# Patient Record
Sex: Female | Born: 1950 | Race: White | Hispanic: No | Marital: Single | Smoking: Former smoker
Health system: Southern US, Community
[De-identification: ages and names within clinical notes are randomized; demographics above are authoritative.]

## PROBLEM LIST (undated history)

## (undated) DIAGNOSIS — M1990 Unspecified osteoarthritis, unspecified site: Secondary | ICD-10-CM

## (undated) DIAGNOSIS — I10 Essential (primary) hypertension: Secondary | ICD-10-CM

## (undated) HISTORY — PX: VAGINAL HYSTERECTOMY: SUR661

## (undated) HISTORY — PX: ABDOMINAL HYSTERECTOMY: SHX81

## (undated) HISTORY — DX: Unspecified osteoarthritis, unspecified site: M19.90

## (undated) HISTORY — DX: Essential (primary) hypertension: I10

## (undated) HISTORY — PX: KNEE SURGERY: SHX244

---

## 1998-01-22 ENCOUNTER — Inpatient Hospital Stay (HOSPITAL_COMMUNITY): Admission: AD | Admit: 1998-01-22 | Discharge: 1998-01-22 | Payer: Self-pay | Admitting: Obstetrics and Gynecology

## 1999-11-17 ENCOUNTER — Other Ambulatory Visit: Admission: RE | Admit: 1999-11-17 | Discharge: 1999-11-17 | Payer: Self-pay | Admitting: Obstetrics and Gynecology

## 2000-12-07 ENCOUNTER — Other Ambulatory Visit: Admission: RE | Admit: 2000-12-07 | Discharge: 2000-12-07 | Payer: Self-pay | Admitting: Obstetrics and Gynecology

## 2001-04-20 ENCOUNTER — Ambulatory Visit (HOSPITAL_COMMUNITY): Admission: RE | Admit: 2001-04-20 | Discharge: 2001-04-20 | Payer: Self-pay | Admitting: Gastroenterology

## 2001-06-08 ENCOUNTER — Encounter: Payer: Self-pay | Admitting: Internal Medicine

## 2001-06-08 ENCOUNTER — Encounter: Admission: RE | Admit: 2001-06-08 | Discharge: 2001-06-08 | Payer: Self-pay | Admitting: Internal Medicine

## 2006-09-06 ENCOUNTER — Emergency Department (HOSPITAL_COMMUNITY): Admission: EM | Admit: 2006-09-06 | Discharge: 2006-09-06 | Payer: Self-pay | Admitting: Emergency Medicine

## 2014-01-16 ENCOUNTER — Emergency Department (HOSPITAL_COMMUNITY): Payer: Self-pay

## 2014-01-16 ENCOUNTER — Inpatient Hospital Stay: Admit: 2014-01-16 | Payer: Self-pay | Admitting: Orthopedic Surgery

## 2014-01-16 ENCOUNTER — Encounter (HOSPITAL_COMMUNITY): Payer: Self-pay | Admitting: Emergency Medicine

## 2014-01-16 ENCOUNTER — Encounter (HOSPITAL_COMMUNITY): Payer: Self-pay | Admitting: Anesthesiology

## 2014-01-16 ENCOUNTER — Emergency Department (HOSPITAL_COMMUNITY): Payer: Self-pay | Admitting: Anesthesiology

## 2014-01-16 ENCOUNTER — Ambulatory Visit (HOSPITAL_COMMUNITY)
Admission: EM | Admit: 2014-01-16 | Discharge: 2014-01-17 | Disposition: A | Discharge status: Home / Self Care | Payer: Self-pay | Attending: Emergency Medicine | Admitting: Emergency Medicine

## 2014-01-16 ENCOUNTER — Encounter (HOSPITAL_COMMUNITY)
Admission: EM | Disposition: A | Discharge status: Home / Self Care | Payer: Self-pay | Source: Home / Self Care | Attending: Emergency Medicine

## 2014-01-16 DIAGNOSIS — IMO0002 Reserved for concepts with insufficient information to code with codable children: Secondary | ICD-10-CM | POA: Insufficient documentation

## 2014-01-16 DIAGNOSIS — S62619B Displaced fracture of proximal phalanx of unspecified finger, initial encounter for open fracture: Secondary | ICD-10-CM

## 2014-01-16 DIAGNOSIS — S62309A Unspecified fracture of unspecified metacarpal bone, initial encounter for closed fracture: Secondary | ICD-10-CM

## 2014-01-16 DIAGNOSIS — W230XXA Caught, crushed, jammed, or pinched between moving objects, initial encounter: Secondary | ICD-10-CM | POA: Insufficient documentation

## 2014-01-16 DIAGNOSIS — S92309A Fracture of unspecified metatarsal bone(s), unspecified foot, initial encounter for closed fracture: Secondary | ICD-10-CM | POA: Insufficient documentation

## 2014-01-16 HISTORY — PX: OPEN REDUCTION INTERNAL FIXATION (ORIF) FINGER WITH RADIAL BONE GRAFT: SHX5666

## 2014-01-16 SURGERY — OPEN REDUCTION INTERNAL FIXATION (ORIF) FINGER WITH RADIAL BONE GRAFT
Anesthesia: General | Site: Hand | Laterality: Left

## 2014-01-16 MED ORDER — SUCCINYLCHOLINE CHLORIDE 20 MG/ML IJ SOLN
INTRAMUSCULAR | Status: DC | PRN
  Administered 2014-01-16: 120 mg via INTRAVENOUS

## 2014-01-16 MED ORDER — LACTATED RINGERS IV SOLN
INTRAVENOUS | Status: DC | PRN
  Administered 2014-01-16 (×2): via INTRAVENOUS

## 2014-01-16 MED ORDER — PROPOFOL 10 MG/ML IV BOLUS
INTRAVENOUS | Status: DC | PRN
Start: 2014-01-16 — End: 2014-01-17
  Administered 2014-01-16: 150 mg via INTRAVENOUS
  Administered 2014-01-16: 50 mg via INTRAVENOUS

## 2014-01-16 MED ORDER — OXYCODONE HCL 5 MG PO TABS: 5.0000 mg | ORAL_TABLET | Freq: Once | ORAL | Status: AC | PRN

## 2014-01-16 MED ORDER — ONDANSETRON HCL 4 MG/2ML IJ SOLN
4.0000 mg | Freq: Once | INTRAMUSCULAR | Status: AC
  Administered 2014-01-16: 4 mg via INTRAVENOUS
  Filled 2014-01-16: qty 2

## 2014-01-16 MED ORDER — FENTANYL CITRATE 0.05 MG/ML IJ SOLN
100.0000 ug | Freq: Once | INTRAMUSCULAR | Status: AC
  Administered 2014-01-16: 100 ug via INTRAVENOUS
  Filled 2014-01-16: qty 2

## 2014-01-16 MED ORDER — SUCCINYLCHOLINE CHLORIDE 20 MG/ML IJ SOLN
INTRAMUSCULAR | Status: AC
  Filled 2014-01-16: qty 1

## 2014-01-16 MED ORDER — PROPOFOL 10 MG/ML IV BOLUS
INTRAVENOUS | Status: AC
  Filled 2014-01-16: qty 20

## 2014-01-16 MED ORDER — CEFAZOLIN SODIUM-DEXTROSE 2-3 GM-% IV SOLR
INTRAVENOUS | Status: AC
  Filled 2014-01-16: qty 50

## 2014-01-16 MED ORDER — SUFENTANIL CITRATE 50 MCG/ML IV SOLN
INTRAVENOUS | Status: DC | PRN
  Administered 2014-01-16 (×3): 10 ug via INTRAVENOUS

## 2014-01-16 MED ORDER — 0.9 % SODIUM CHLORIDE (POUR BTL) OPTIME
TOPICAL | Status: DC | PRN
  Administered 2014-01-16 (×2): 1000 mL

## 2014-01-16 MED ORDER — ONDANSETRON HCL 4 MG/2ML IJ SOLN: 4.0000 mg | Freq: Once | INTRAMUSCULAR | Status: AC | PRN

## 2014-01-16 MED ORDER — ONDANSETRON HCL 4 MG/2ML IJ SOLN
INTRAMUSCULAR | Status: DC | PRN
  Administered 2014-01-16: 4 mg via INTRAVENOUS

## 2014-01-16 MED ORDER — LIDOCAINE HCL (CARDIAC) 20 MG/ML IV SOLN
INTRAVENOUS | Status: DC | PRN
  Administered 2014-01-16: 100 mg via INTRAVENOUS

## 2014-01-16 MED ORDER — SODIUM CHLORIDE 0.9 % IJ SOLN
INTRAMUSCULAR | Status: AC
  Filled 2014-01-16: qty 10

## 2014-01-16 MED ORDER — OXYCODONE HCL 5 MG/5ML PO SOLN: 5.0000 mg | Freq: Once | ORAL | Status: AC | PRN

## 2014-01-16 MED ORDER — HYDROMORPHONE HCL PF 1 MG/ML IJ SOLN
1.0000 mg | Freq: Once | INTRAMUSCULAR | Status: AC
  Administered 2014-01-16: 1 mg via INTRAVENOUS
  Filled 2014-01-16: qty 1

## 2014-01-16 MED ORDER — CEFAZOLIN SODIUM-DEXTROSE 2-3 GM-% IV SOLR
INTRAVENOUS | Status: DC | PRN
  Administered 2014-01-16: 2 g via INTRAVENOUS

## 2014-01-16 MED ORDER — BUPIVACAINE-EPINEPHRINE PF 0.5-1:200000 % IJ SOLN
INTRAMUSCULAR | Status: DC | PRN
  Administered 2014-01-16: 30 mL via PERINEURAL

## 2014-01-16 MED ORDER — LIDOCAINE HCL (CARDIAC) 20 MG/ML IV SOLN
INTRAVENOUS | Status: AC
  Filled 2014-01-16: qty 5

## 2014-01-16 MED ORDER — MEPERIDINE HCL 25 MG/ML IJ SOLN: 6.2500 mg | INTRAMUSCULAR | Status: DC | PRN

## 2014-01-16 MED ORDER — BUPIVACAINE HCL (PF) 0.25 % IJ SOLN
INTRAMUSCULAR | Status: AC
  Filled 2014-01-16: qty 30

## 2014-01-16 MED ORDER — MIDAZOLAM HCL 5 MG/5ML IJ SOLN
INTRAMUSCULAR | Status: DC | PRN
  Administered 2014-01-16: 2 mg via INTRAVENOUS

## 2014-01-16 MED ORDER — DEXAMETHASONE SODIUM PHOSPHATE 4 MG/ML IJ SOLN
INTRAMUSCULAR | Status: DC | PRN
  Administered 2014-01-16: 4 mg via INTRAVENOUS
  Administered 2014-01-16: 4 mg via PERINEURAL

## 2014-01-16 MED ORDER — SUFENTANIL CITRATE 50 MCG/ML IV SOLN
INTRAVENOUS | Status: AC
  Filled 2014-01-16: qty 1

## 2014-01-16 MED ORDER — HYDROMORPHONE HCL PF 1 MG/ML IJ SOLN: 0.2500 mg | INTRAMUSCULAR | Status: DC | PRN

## 2014-01-16 MED ORDER — MIDAZOLAM HCL 2 MG/2ML IJ SOLN
INTRAMUSCULAR | Status: AC
  Filled 2014-01-16: qty 2

## 2014-01-16 MED ORDER — CEFAZOLIN SODIUM 1-5 GM-% IV SOLN
1.0000 g | Freq: Once | INTRAVENOUS | Status: AC
  Administered 2014-01-16: 1 g via INTRAVENOUS
  Filled 2014-01-16: qty 50

## 2014-01-16 SURGICAL SUPPLY — 42 items
BANDAGE ELASTIC 3 VELCRO ST LF (GAUZE/BANDAGES/DRESSINGS) ×3 IMPLANT
BANDAGE ELASTIC 4 VELCRO ST LF (GAUZE/BANDAGES/DRESSINGS) ×3 IMPLANT
BANDAGE GAUZE ELAST BULKY 4 IN (GAUZE/BANDAGES/DRESSINGS) ×3 IMPLANT
BNDG CMPR 9X4 STRL LF SNTH (GAUZE/BANDAGES/DRESSINGS) ×1
BNDG ESMARK 4X9 LF (GAUZE/BANDAGES/DRESSINGS) ×3 IMPLANT
BNDG GAUZE ELAST 4 BULKY (GAUZE/BANDAGES/DRESSINGS) ×3 IMPLANT
CORDS BIPOLAR (ELECTRODE) ×3 IMPLANT
DRAPE OEC MINIVIEW 54X84 (DRAPES) ×3 IMPLANT
DRAPE SURG 17X23 STRL (DRAPES) ×3 IMPLANT
DRSG ADAPTIC 3X8 NADH LF (GAUZE/BANDAGES/DRESSINGS) IMPLANT
DRSG EMULSION OIL 3X3 NADH (GAUZE/BANDAGES/DRESSINGS) ×3 IMPLANT
DRSG PAD ABDOMINAL 8X10 ST (GAUZE/BANDAGES/DRESSINGS) ×6 IMPLANT
GAUZE XEROFORM 1X8 LF (GAUZE/BANDAGES/DRESSINGS) ×3 IMPLANT
GAUZE XEROFORM 5X9 LF (GAUZE/BANDAGES/DRESSINGS) ×3 IMPLANT
GLOVE BIO SURGEON STRL SZ7.5 (GLOVE) ×3 IMPLANT
GLOVE BIOGEL PI IND STRL 8 (GLOVE) ×1 IMPLANT
GLOVE BIOGEL PI INDICATOR 8 (GLOVE) ×2
GOWN STRL REUS W/ TWL XL LVL3 (GOWN DISPOSABLE) ×1 IMPLANT
GOWN STRL REUS W/TWL XL LVL3 (GOWN DISPOSABLE) ×3
HANDPIECE INTERPULSE COAX TIP (DISPOSABLE)
K-WIRE DBL TROCAR .035X4 ×9 IMPLANT
KIT BASIN OR (CUSTOM PROCEDURE TRAY) ×3 IMPLANT
KIT ROOM TURNOVER OR (KITS) ×3 IMPLANT
KWIRE DBL TROCAR .035X4 ×3 IMPLANT
MANIFOLD NEPTUNE II (INSTRUMENTS) ×3 IMPLANT
NS IRRIG 1000ML POUR BTL (IV SOLUTION) ×3 IMPLANT
PACK ORTHO EXTREMITY (CUSTOM PROCEDURE TRAY) ×3 IMPLANT
PAD ARMBOARD 7.5X6 YLW CONV (MISCELLANEOUS) ×6 IMPLANT
PAD CAST 4YDX4 CTTN HI CHSV (CAST SUPPLIES) ×1 IMPLANT
PADDING CAST COTTON 4X4 STRL (CAST SUPPLIES) ×3
SET HNDPC FAN SPRY TIP SCT (DISPOSABLE) IMPLANT
SPONGE GAUZE 4X4 12PLY (GAUZE/BANDAGES/DRESSINGS) ×3 IMPLANT
SPONGE LAP 18X18 X RAY DECT (DISPOSABLE) IMPLANT
SPONGE LAP 4X18 X RAY DECT (DISPOSABLE) ×3 IMPLANT
SUT ETHILON 4 0 PS 2 18 (SUTURE) ×6 IMPLANT
TOWEL OR 17X24 6PK STRL BLUE (TOWEL DISPOSABLE) ×3 IMPLANT
TOWEL OR 17X26 10 PK STRL BLUE (TOWEL DISPOSABLE) ×3 IMPLANT
TUBE CONNECTING 12'X1/4 (SUCTIONS) ×1
TUBE CONNECTING 12X1/4 (SUCTIONS) ×2 IMPLANT
TUBE FEEDING 5FR 15 INCH (TUBING) IMPLANT
UNDERPAD 30X30 INCONTINENT (UNDERPADS AND DIAPERS) ×3 IMPLANT
WATER STERILE IRR 1000ML POUR (IV SOLUTION) ×3 IMPLANT

## 2014-01-16 NOTE — ED Notes (Signed)
[facility] here to take pt to [facility] ED to meet Dr. Maryan Rued

## 2014-01-16 NOTE — ED Provider Notes (Signed)
Patient transferred from [facility] to see Dr. Fredna Dow for treatment of multiple fractures of the left hand, with possible open intra-articular fracture of proximal phalanx of index finger.  Hand is splinted.  Fingers warm to touch, brisk capillary refill.    Norman Herrlich, NP 01/17/14 [phone]

## 2014-01-16 NOTE — ED Provider Notes (Signed)
CSN: [id number]     Arrival date & time 01/16/14  1700 History   First MD Initiated Contact with Patient 01/16/14 1825     Chief Complaint  Patient presents with  . Hand Pain     (Consider location/radiation/quality/duration/timing/severity/associated sxs/prior Treatment) Patient is a 62 y.o. female presenting with hand pain. The history is provided by the patient.  Hand Pain This is a new problem. Pertinent negatives include no chest pain and no shortness of breath.   patient got her left hand caught log splitter just prior to arrival.` She has pain and deformity. There is mild bleeding. Tetanus is up-to-date. No numbness. No other injury.  History reviewed. No pertinent past medical history. Past Surgical History  Procedure Laterality Date  . Abdominal hysterectomy    . Knee surgery Left    No family history on file. History  Substance Use Topics  . Smoking status: Never Smoker   . Smokeless tobacco: Not on file  . Alcohol Use: 0.0 oz/week    2-3 Glasses of wine per week   OB History   Grav Para Term Preterm Abortions TAB SAB Ect Mult Living                 Review of Systems  Constitutional: Negative for appetite change.  Respiratory: Negative for shortness of breath.   Cardiovascular: Negative for chest pain.  Musculoskeletal:       Left hand pain and deformity  Skin: Positive for wound.  Neurological: Negative for numbness.      Allergies  Review of patient's allergies indicates no known allergies.  Home Medications   Current Outpatient Rx  Name  Route  Sig  Dispense  Refill  . ALPRAZolam (XANAX) 0.25 MG tablet   Oral   Take 0.5 mg by mouth at bedtime as needed for sleep.         Marland Kitchen OVER THE COUNTER MEDICATION   Oral   Take 1 each by mouth daily. Smoothie with vitamins          BP 141/92  Pulse 80  Temp(Src) 97.9 F (36.6 C) (Oral)  Resp 20  Wt 115 lb (52.164 kg)  SpO2 98% Physical Exam  Constitutional: She is oriented to person, place, and  time. She appears well-developed and well-nourished.  HENT:  Head: Normocephalic.  Cardiovascular: Normal rate and regular rhythm.   Pulmonary/Chest: Effort normal and breath sounds normal.  Abdominal: There is no tenderness.  Musculoskeletal:  Swelling and deformity of left hand over the area of second and third MCP joint. There is dorsal swelling. There is tenderness of the left index finger. There is approximately 1.5 cm laceration over the medial aspect of proximal phalanx. Sensation is grossly intact over distal phalanx of index finger medially and laterally. She is able to flex and extend the finger, although with some pain and decreased range of motion. There is tenderness somewhat diffusely over the hand also.  Neurological: She is alert and oriented to person, place, and time.   strong radial pulse on right.  ED Course  Procedures (including critical care time) Labs Review Labs Reviewed - No data to display Imaging Review Dg Hand Complete Left  01/16/2014   CLINICAL DATA:  Injury.  Left hand pain.  EXAM: LEFT HAND - COMPLETE 3+ VIEW  COMPARISON:  None.  FINDINGS: There is marked soft tissue swelling about the hand. There is an acute fracture of the proximal phalanx of the left index finger. The fracture extends from the  volar aspect of the articular surface approximately to the proximal diaphysis. Transverse component through the metaphysis shows mild dorsal angulation. The base of the proximal phalanx of the index finger appears volarly subluxed off the second metacarpal head. There is a nondisplaced fracture extending from the mid diaphysis of the second metacarpal toward the metacarpal head. No other acute bony or joint abnormality is identified. 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  IMPRESSION: Fracture of the proximal phalanx of the left index finger extends from the articular surface at the second MCP joint into the proximal metaphysis and diaphysis.  Nondisplaced fracture  distal first metacarpal.  The base of the proximal phalanx of the left index finger appears volarly subluxed off the second metacarpal head although visualization is limited on the lateral view.  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   Electronically Signed   By: Inge Rise M.D.   On: 01/16/2014 18:25     EKG Interpretation None      MDM   Final diagnoses:  Fracture, metacarpal  Fracture of finger, proximal phalanx, left, open    Patient with hand caught in log splitter. Has multiple fractures in hand. The intra-articular fracture of the proximal phalanx of the index finger may be open. Discussed with Dr. Fredna Dow, who will take the patient in transfer to [facility] cone to go to the operating room. Patient's tetanus is up to date. She was given pain medicines and empiric antibiotics.    Jasper Riling. Alvino Chapel, MD 01/16/14 [phone]

## 2014-01-16 NOTE — ED Notes (Signed)
Bed: WA08 Expected date:  Expected time:  Means of arrival:  Comments: Hold for triage 

## 2014-01-16 NOTE — H&P (Signed)
Desiree Rodriguez is an 63 y.o. female.   Chief Complaint: left hand crush injury HPI: 63 yo lhd female states she got left hand crushed in log splitter ~ 4 PM today.  Seen at [facility] where XR revealed left index finger proximal phalanx fracture and metacarpal fracture.  Also noted to have laceration on index finger.  I was consulted for management of the injury.  Patient transferred to [facility] for evaluation and care.  Reports no previous injury to left hand and no other injury at this time.  History reviewed. No pertinent past medical history.  Past Surgical History  Procedure Laterality Date  . Abdominal hysterectomy    . Knee surgery Left     No family history on file. Social History:  reports that she has never smoked. She does not have any smokeless tobacco history on file. She reports that she drinks alcohol. Her drug history is not on file.  Allergies: No Known Allergies   (Not in a hospital admission)  No results found for this or any previous visit (from the past 48 hour(s)).  Dg Hand Complete Left  01/16/2014   CLINICAL DATA:  Injury.  Left hand pain.  EXAM: LEFT HAND - COMPLETE 3+ VIEW  COMPARISON:  None.  FINDINGS: There is marked soft tissue swelling about the hand. There is an acute fracture of the proximal phalanx of the left index finger. The fracture extends from the volar aspect of the articular surface approximately to the proximal diaphysis. Transverse component through the metaphysis shows mild dorsal angulation. The base of the proximal phalanx of the index finger appears volarly subluxed off the second metacarpal head. There is a nondisplaced fracture extending from the mid diaphysis of the second metacarpal toward the metacarpal head. No other acute bony or joint abnormality is identified. 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  IMPRESSION: Fracture of the proximal phalanx of the left index finger extends from the articular surface at the second MCP joint into the  proximal metaphysis and diaphysis.  Nondisplaced fracture distal first metacarpal.  The base of the proximal phalanx of the left index finger appears volarly subluxed off the second metacarpal head although visualization is limited on the lateral view.  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   Electronically Signed   By: Inge Rise M.D.   On: 01/16/2014 18:25     A comprehensive review of systems was negative.  Blood pressure 141/92, pulse 80, temperature 97.9 F (36.6 C), temperature source Oral, resp. rate 20, weight 52.164 kg (115 lb), SpO2 98.00%.  General appearance: alert, cooperative and appears stated age Head: Normocephalic, without obvious abnormality, atraumatic Neck: supple, symmetrical, trachea midline Resp: clear to auscultation bilaterally Cardio: regular rate and rhythm GI: non tender Extremities: intact sensation and capillary refill all digits. +epl/fpl/io.  left index with wound at volar aspect proximal phalanx.  deformity index proximal phalanx. fdp with flicker of movement.   hand swollen.  pain with motion of index and long finger mp joints.  small abrasions dorsum of hand. Pulses: 2+ and symmetric Skin: Skin color, texture, turgor normal. No rashes or lesions Neurologic: Grossly normal Incision/Wound: As above  Assessment/Plan Left hand crush injury with index proximal phalanx and metacarpal fractures and volar wound.  Recommend OR for I&D of wound with pinning vs ORIF of fractures and fasciotomies of hand for possible developing compartment syndrome.  Risks, benefits, and alternatives of surgery were discussed and the patient agrees with the plan of care.    Corrisa Gibby R 01/16/2014,  9:55 PM

## 2014-01-16 NOTE — Anesthesia Preprocedure Evaluation (Signed)
Anesthesia Evaluation  Patient identified by MRN, date of birth, ID band Patient awake    Reviewed: Allergy & Precautions, H&P , NPO status , Patient's Chart, lab work & pertinent test results  Airway Mallampati: I  Neck ROM: Full    Dental   Pulmonary          Cardiovascular     Neuro/Psych    GI/Hepatic   Endo/Other    Renal/GU      Musculoskeletal   Abdominal   Peds  Hematology   Anesthesia Other Findings   Reproductive/Obstetrics                           Anesthesia Physical Anesthesia Plan  ASA: I and emergent  Anesthesia Plan: General   Post-op Pain Management:    Induction: Intravenous, Rapid sequence and Cricoid pressure planned  Airway Management Planned: Oral ETT  Additional Equipment:   Intra-op Plan:   Post-operative Plan: Extubation in OR  Informed Consent: I have reviewed the patients History and Physical, chart, labs and discussed the procedure including the risks, benefits and alternatives for the proposed anesthesia with the patient or authorized representative who has indicated his/her understanding and acceptance.     Plan Discussed with: CRNA and Surgeon  Anesthesia Plan Comments:         Anesthesia Quick Evaluation  

## 2014-01-16 NOTE — Anesthesia Procedure Notes (Addendum)
Procedure Name: Intubation Date/Time: 01/16/2014 10:55 PM Performed by: Claris Che Pre-anesthesia Checklist: Patient identified, Emergency Drugs available, Suction available and Patient being monitored Patient Re-evaluated:Patient Re-evaluated prior to inductionOxygen Delivery Method: Circle system utilized Preoxygenation: Pre-oxygenation with 100% oxygen Intubation Type: IV induction, Rapid sequence and Cricoid Pressure applied Ventilation: Mask ventilation without difficulty Laryngoscope Size: Mac and 3 Grade View: Grade I Tube type: Oral Tube size: 8.0 mm Number of attempts: 1 Airway Equipment and Method: Stylet and Oral airway Placement Confirmation: ETT inserted through vocal cords under direct vision,  positive ETCO2 and breath sounds checked- equal and bilateral Secured at: 22 cm Tube secured with: Tape Dental Injury: Teeth and Oropharynx as per pre-operative assessment    Anesthesia Regional Block:  Supraclavicular block  Pre-Anesthetic Checklist: ,, timeout performed, Correct Patient, Correct Site, Correct Laterality, Correct Procedure, Correct Position, site marked, Risks and benefits discussed,  Surgical consent,  Pre-op evaluation,  At surgeon's request and post-op pain management  Laterality: Left  Prep: chloraprep       Needles:  Injection technique: Single-shot  Needle Type: Echogenic Stimulator Needle     Needle Length: 9cm 9 cm Needle Gauge: 21 and 21 G    Additional Needles:  Procedures: ultrasound guided (picture in chart) and nerve stimulator Supraclavicular block  Nerve Stimulator or Paresthesia:  Response: 0.4 mA,   Additional Responses:   Narrative:  Start time: 01/16/2014 10:15 PM End time: 01/16/2014 10:30 PM Injection made incrementally with aspirations every 5 mL.  Performed by: Personally  Anesthesiologist: Lillia Abed MD  Additional Notes: Monitors applied. Patient sedated. Sterile prep and drape,hand hygiene and sterile gloves  were used. Relevant anatomy identified.Needle position confirmed.Local anesthetic injected incrementally after negative aspiration. Local anesthetic spread visualized around nerve(s). Vascular puncture avoided. No complications. Image printed for medical record.The patient tolerated the procedure well.

## 2014-01-16 NOTE — ED Notes (Signed)
Pt exam by Dr Fredna Dow, or consent sign by patient,  Pt awaiting to go to OR

## 2014-01-16 NOTE — ED Notes (Signed)
Pt crushed L hand in log splitter around 1600 today. Deformity noted to hand. No active bleeding at this time.

## 2014-01-16 NOTE — ED Notes (Signed)
Pt's ring removed from 4th finger on L hand.

## 2014-01-17 ENCOUNTER — Encounter (HOSPITAL_COMMUNITY): Payer: Self-pay | Admitting: Orthopedic Surgery

## 2014-01-17 MED ORDER — OXYCODONE-ACETAMINOPHEN 5-325 MG PO TABS: ORAL_TABLET | ORAL | Status: AC

## 2014-01-17 MED ORDER — SULFAMETHOXAZOLE-TRIMETHOPRIM 800-160 MG PO TABS: 1.0000 | ORAL_TABLET | Freq: Two times a day (BID) | ORAL | Status: DC

## 2014-01-17 NOTE — Op Note (Signed)
Intra-operative fluoroscopic images in the AP, lateral, and oblique views were taken and evaluated by myself.  Reduction and hardware placement were confirmed.  There was no intraarticular penetration of permanent hardware.  

## 2014-01-17 NOTE — Transfer of Care (Signed)
Immediate Anesthesia Transfer of Care Note  Patient: Desiree Rodriguez  Procedure(s) Performed: Procedure(s): Irrigation and debridement OPEN REDUCTION INTERNAL FIXATION (ORIF) Left Index FINGER, fasciotomy (Left)  Patient Location: PACU  Anesthesia Type:GA combined with regional for post-op pain  Level of Consciousness: awake, alert , oriented and patient cooperative  Airway & Oxygen Therapy: Patient Spontanous Breathing and Patient connected to nasal cannula oxygen  Post-op Assessment: Report given to PACU RN, Post -op Vital signs reviewed and stable and Patient moving all extremities X 4  Post vital signs: Reviewed and stable  Complications: No apparent anesthesia complications

## 2014-01-17 NOTE — Discharge Instructions (Signed)

## 2014-01-17 NOTE — Anesthesia Postprocedure Evaluation (Signed)
Anesthesia Post Note  Patient: Desiree Rodriguez  Procedure(s) Performed: Procedure(s) (LRB): Irrigation and debridement OPEN REDUCTION INTERNAL FIXATION (ORIF) Left Index FINGER, fasciotomy (Left)  Anesthesia type: general  Patient location: PACU  Post pain: Pain level controlled  Post assessment: Patient's Cardiovascular Status Stable  Last Vitals:  Filed Vitals:   01/17/14 0100  BP: 143/89  Pulse: 88  Temp: 36.6 C  Resp: 12    Post vital signs: Reviewed and stable  Level of consciousness: sedated  Complications: No apparent anesthesia complications

## 2014-01-17 NOTE — Brief Op Note (Signed)
01/16/2014 - 01/17/2014  12:32 AM  PATIENT:  Desiree Rodriguez  63 y.o. female  PRE-OPERATIVE DIAGNOSIS:  Crush Injury to Left Index Finger  POST-OPERATIVE DIAGNOSIS:  Crush Injury to Left Index Finger  PROCEDURE:  Procedure(s): Irrigation and debridement OPEN REDUCTION INTERNAL FIXATION (ORIF) Left Index FINGER, fasciotomy (Left)  SURGEON:  Surgeon(s) and Role:    * Tennis Must, MD - Primary  PHYSICIAN ASSISTANT:   ASSISTANTS: none   ANESTHESIA:   regional and general  EBL:  Total I/O In: 1000 [I.V.:1000] Out: -   BLOOD ADMINISTERED:none  DRAINS: none   LOCAL MEDICATIONS USED:  NONE  SPECIMEN:  No Specimen  DISPOSITION OF SPECIMEN:  N/A  COUNTS:  YES  TOURNIQUET:   Total Tourniquet Time Documented: Upper Arm (Left) - 77 minutes Total: Upper Arm (Left) - 77 minutes   DICTATION: .Other Dictation: Dictation Number [id number]  PLAN OF CARE: Discharge to home after PACU  PATIENT DISPOSITION:  PACU - hemodynamically stable.

## 2014-01-17 NOTE — ED Provider Notes (Signed)
Medical screening examination/treatment/procedure(s) were performed by non-physician practitioner and as supervising physician I was immediately available for consultation/collaboration.   EKG Interpretation None        Ephraim Hamburger, MD 01/17/14 1414

## 2014-01-17 NOTE — Op Note (Signed)
NAME:  Desiree Rodriguez, Desiree Rodriguez NO.:  [id number]  MEDICAL RECORD NO.:  [id number]  LOCATION:  [facility]                         FACILITY:  [facility]  PHYSICIAN:  Leanora Cover, MD        DATE OF BIRTH:  [birth year]  DATE OF PROCEDURE:  01/16/2014 DATE OF DISCHARGE:                              OPERATIVE REPORT   PREOPERATIVE DIAGNOSIS:  Crush injury of left hand with index finger proximal phalanx and metacarpal fractures and possible early compartment syndrome.  POSTOPERATIVE DIAGNOSIS:  Crush injury of left hand with index finger proximal phalanx and metacarpal fractures and possible early compartment syndrome.  PROCEDURES:   1. Left hand exploration of volar index finger wound and repair of wound 2. Fasciotomy left hand 3. Closed reduction and percutaneous pinning of index finger proximal phalanx   fracture, intra-articular at MP joint 4. Percutaneous pinning of index finger metacarpal fracture  SURGEON:  Leanora Cover, MD  ASSISTANT:  None.  ANESTHESIA:  General with regional.  IV FLUIDS:  Per anesthesia flow sheet.  ESTIMATED BLOOD LOSS:  Minimal.  COMPLICATIONS:  None.  SPECIMENS:  None.  TOURNIQUET TIME:  77 minutes.  DISPOSITION:  Stable to PACU.  INDICATIONS:  Ms. Clanton is a 63 year old left-hand dominant female who states that in the afternoon of January 16, 2014, she was using a wood splitter, when her hand was caught between the wood and the base.  This caused a crush injury to the hand.  She was seen at [facility] Emergency Department where radiographs were taken, revealing an index finger proximal phalanx fracture that was felt to be open and an index finger metacarpal fracture.  I was consulted for management of injury.  She was transferred to [facility] for my care.  On examination, she had intact sensation and capillary refill in all fingertips.  She could flex and extend at the IP joint of thumb and cross her fingers lightly.  She had pain with  extension of the long finger at the MP joint and tenderness to palpation in the index finger and index finger metacarpal.  She was significantly swollen on the dorsum and volar aspect of the hand.  She was not tender in the thenar eminence.  She was less tender at the ulnar side of the hand.  There was a wound at the volar aspect of the proximal phalanx of the index finger.  I recommend to Ms. Bagent going to the operating room for irrigation, debridement and exploration of the wound, fasciotomies for prophylaxis against potential early compartment syndrome, and percutaneous pinning versus open reduction and internal fixation of index finger proximal phalanx intra-articular fracture, and metacarpal fracture.  Risks, benefits and alternatives of surgery were discussed including the risk of blood loss; infection; damage to nerves, vessels, tendons, ligaments, bone; failure of surgery; need for additional surgery; complications with wound healing; nonunion; malunion; stiffness.  She voiced understanding of these risks and elected to proceed.  OPERATIVE COURSE:  After being identified preoperatively by myself, the patient and I agreed upon the procedure and site of procedure.  Surgical site was marked.  Risks, benefits, and alternatives of surgery were reviewed and she wished to proceed.  Surgical consent  had been signed. She was given IV Ancef as preoperative antibiotic prophylaxis and her tetanus had been updated in the emergency department.  Regional block was performed by Anesthesia in preoperative holding.  She was transferred to the operating room and placed on the operating room table in a supine position with the left upper extremity on an armboard. General anesthesia was induced by Anesthesiology.  Left upper extremity was prepped and draped in normal sterile orthopedic fashion.  A surgical pause was performed between the surgeons, anesthesia, and operating room staff, and all  were in agreement as to the patient, procedure, and site of procedure.  Tourniquet at the proximal aspect of the extremity was inflated to 250 mmHg after exsanguination of the limb with an Esmarch bandage.  The wound at the volar aspect of the index finger was explored.  It was extended proximally.  Bipolar electrocautery was used to obtain hemostasis.  The radial and ulnar neurovascular bundles were identified and were intact.  There was blood within the tendon sheath. The sheath was opened.  The FDP and FDS tendons were intact.  The wound was copiously irrigated with 1000 mL of sterile saline.  It was then closed with 4-0 nylon suture in a horizontal mattress fashion.  Two incisions were mode on the dorsum of the hand, one between the index and long finger metacarpals and one at the ring finger metacarpal.  These were carried into subcutaneous tissues by spreading technique.  There was significant hematoma and blood within the subcutaneous tissues. Bipolar electrocautery was used to obtain hemostasis.  Fasciotomies were performed of the spaces between the index and long, long and ring and ring and small fingers as well as between the index and thumb metacarpals.  The muscle was not significantly swollen at this time. The wounds were copiously irrigated with 1000 mL of sterile saline by bulb syringe.  The skin was able to be closed with 4-0 nylon in a horizontal mattress fashion without any tension.  Attention was turned to the fractures.  Closed reduction of the index finger proximal phalanx intra-articular fracture was performed.  Two 0.035 inch K-wires were then advanced from the proximal fracture fragments across the fracture site into the distal aspect of the proximal phalanx.  This was adequate to stabilize the fracture.  An additional 0.035-inch K-wire was advanced from radial to ulnar across the intra-articular portion of the fracture at the base of the index finger metacarpal.   These pins were adequate to stabilize the index finger proximal phalanx fracture.  The wrist was placed through tenodesis and there was good cascade of the index finger as well as alignment of the finger without any scissoring.  Two 0.035- inch K-wires were then advanced from radial to ulnar across the fracture of the index finger metacarpal.  The C-arm was used in AP, lateral, and oblique projections throughout the case to aid in reduction and position of hardware.  Good reduction and pin position was achieved.  The pins were all bent and cut short.  The wounds and pin sites were all dressed with sterile Xeroform, 4x4s, and wrapped with a Kerlix bandage.  A volar and dorsal slab splint was placed including the index, long, ring, and small fingers with the MPs flexed and the IPs extended.  This was wrapped with Kerlix and Ace bandage.  Tourniquet was deflated at 77 minutes. Fingertips were pink with brisk capillary refill after deflation of the tourniquet.  The operative drapes were broken down, and the patient  was awoken from anesthesia safely.  She was transferred back to the stretcher and taken to PACU in stable condition.  I will see her back in the office in 1 week for postoperative followup.  I will give her Percocet 5/325, 1-2 p.o. q.6 hours p.r.n. pain, dispensed #40, and Bactrim DS 1 p.o. b.i.d. x7 days.     Leanora Cover, MD     KK/MEDQ  D:  01/17/2014  T:  01/17/2014  Job:  [id number]

## 2014-01-17 NOTE — Op Note (Signed)
[id number] 

## 2016-12-17 ENCOUNTER — Encounter: Payer: Self-pay | Admitting: Gastroenterology

## 2017-02-02 ENCOUNTER — Ambulatory Visit (AMBULATORY_SURGERY_CENTER): Payer: Self-pay

## 2017-02-02 VITALS — Ht 62.0 in | Wt 113.0 lb

## 2017-02-02 DIAGNOSIS — Z1211 Encounter for screening for malignant neoplasm of colon: Secondary | ICD-10-CM

## 2017-02-02 MED ORDER — NA SULFATE-K SULFATE-MG SULF 17.5-3.13-1.6 GM/177ML PO SOLN: 1.0000 | Freq: Once | ORAL | 0 refills | Status: AC

## 2017-02-02 NOTE — Progress Notes (Signed)
Denies allergies to eggs or soy products. Denies complication of anesthesia or sedation. Denies use of weight loss medication. Denies use of O2.   Emmi instructions given for colonoscopy.  

## 2017-02-03 ENCOUNTER — Encounter: Payer: Self-pay | Admitting: Gastroenterology

## 2017-02-11 ENCOUNTER — Ambulatory Visit (AMBULATORY_SURGERY_CENTER): Payer: Medicare Other | Admitting: Gastroenterology

## 2017-02-11 ENCOUNTER — Encounter: Payer: Self-pay | Admitting: Gastroenterology

## 2017-02-11 VITALS — BP 108/72 | HR 71 | Temp 98.4°F | Resp 14 | Ht 62.0 in | Wt 113.0 lb

## 2017-02-11 DIAGNOSIS — Z1211 Encounter for screening for malignant neoplasm of colon: Secondary | ICD-10-CM | POA: Diagnosis present

## 2017-02-11 DIAGNOSIS — Z1212 Encounter for screening for malignant neoplasm of rectum: Secondary | ICD-10-CM | POA: Diagnosis not present

## 2017-02-11 DIAGNOSIS — D123 Benign neoplasm of transverse colon: Secondary | ICD-10-CM | POA: Diagnosis not present

## 2017-02-11 MED ORDER — SODIUM CHLORIDE 0.9 % IV SOLN: 500.0000 mL | INTRAVENOUS | Status: AC

## 2017-02-11 NOTE — Op Note (Signed)
[place] Patient Name: Desiree Rodriguez Procedure Date: 02/11/2017 3:22 PM MRN: [id number] Endoscopist: Remo Lipps P. Montrae Braithwaite MD, MD Age: 66 Referring MD:  Date of Birth: 1950-11-08 Gender: Female Account #: [id number] Procedure:                Colonoscopy Indications:              Screening for colorectal malignant neoplasm Medicines:                Monitored Anesthesia Care Procedure:                Pre-Anesthesia Assessment:                           - Prior to the procedure, a History and Physical                            was performed, and patient medications and                            allergies were reviewed. The patient's tolerance of                            previous anesthesia was also reviewed. The risks                            and benefits of the procedure and the sedation                            options and risks were discussed with the patient.                            All questions were answered, and informed consent                            was obtained. Prior Anticoagulants: The patient has                            taken no previous anticoagulant or antiplatelet                            agents. ASA Grade Assessment: II - A patient with                            mild systemic disease. After reviewing the risks                            and benefits, the patient was deemed in                            satisfactory condition to undergo the procedure.                           After obtaining informed consent, the colonoscope  was passed under direct vision. Throughout the                            procedure, the patient's blood pressure, pulse, and                            oxygen saturations were monitored continuously. The                            Model PCF-H190DL [id number]) scope was introduced                            through the anus and advanced to the the cecum,   identified by appendiceal orifice and ileocecal                            valve. The colonoscopy was performed without                            difficulty. The patient tolerated the procedure                            well. The quality of the bowel preparation was                            good. The ileocecal valve, appendiceal orifice, and                            rectum were photographed. Scope In: 3:23:40 PM Scope Out: 3:43:05 PM Scope Withdrawal Time: 0 hours 15 minutes 52 seconds  Total Procedure Duration: 0 hours 19 minutes 25 seconds  Findings:                 The perianal and digital rectal examinations were                            normal.                           Two sessile polyps were found in the transverse                            colon. The polyps were 3 to 4 mm in size. These                            polyps were removed with a cold snare. Resection                            and retrieval were complete.                           Scattered medium-mouthed diverticula were found in                            the left colon.  Anal papilla(e) were hypertrophied with internal                            hemorrhoids.                           A diffuse area of mild melanosis was found in the                            entire colon.                           The exam was otherwise without abnormality. Complications:            No immediate complications. Estimated blood loss:                            Minimal. Estimated Blood Loss:     Estimated blood loss was minimal. Impression:               - Two 3 to 4 mm polyps in the transverse colon,                            removed with a cold snare. Resected and retrieved.                           - Diverticulosis in the left colon.                           - Anal papilla(e) were hypertrophied.                           - Hemorrhoids                           - Melanosis in the colon.                            - The examination was otherwise normal. Recommendation:           - Patient has a contact number available for                            emergencies. The signs and symptoms of potential                            delayed complications were discussed with the                            patient. Return to normal activities tomorrow.                            Written discharge instructions were provided to the                            patient.                           -  Resume previous diet.                           - Continue present medications.                           - Await pathology results.                           - Repeat colonoscopy is recommended for                            surveillance. The colonoscopy date will be                            determined after pathology results from today's                            exam become available for review.                           - No ibuprofen, naproxen, or other non-steroidal                            anti-inflammatory drugs for 2 weeks after polyp                            removal. Remo Lipps P. Nicholous Girgenti MD, MD 02/11/2017 3:46:42 PM This report has been signed electronically.

## 2017-02-11 NOTE — Patient Instructions (Signed)
YOU HAD AN ENDOSCOPIC PROCEDURE TODAY AT [facility]:   Refer to the procedure report that was given to you for any specific questions about what was found during the examination.  If the procedure report does not answer your questions, please call your gastroenterologist to clarify.  If you requested that your care partner not be given the details of your procedure findings, then the procedure report has been included in a sealed envelope for you to review at your convenience later.  YOU SHOULD EXPECT: Some feelings of bloating in the abdomen. Passage of more gas than usual.  Walking can help get rid of the air that was put into your GI tract during the procedure and reduce the bloating. If you had a lower endoscopy (such as a colonoscopy or flexible sigmoidoscopy) you may notice spotting of blood in your stool or on the toilet paper. If you underwent a bowel prep for your procedure, you may not have a normal bowel movement for a few days.  Please Note:  You might notice some irritation and congestion in your nose or some drainage.  This is from the oxygen used during your procedure.  There is no need for concern and it should clear up in a day or so.  SYMPTOMS TO REPORT IMMEDIATELY:   Following lower endoscopy (colonoscopy or flexible sigmoidoscopy):  Excessive amounts of blood in the stool  Significant tenderness or worsening of abdominal pains  Swelling of the abdomen that is new, acute  Fever of 100F or higher   For urgent or emergent issues, a gastroenterologist can be reached at any hour by calling [phone].   DIET:  We do recommend a small meal at first, but then you may proceed to your regular diet.  Drink plenty of fluids but you should avoid alcoholic beverages for 24 hours. Try to increase the fiber in your diet, and drink plenty of water.  ACTIVITY:  You should plan to take it easy for the rest of today and you should NOT DRIVE or use heavy machinery until  tomorrow (because of the sedation medicines used during the test).    FOLLOW UP: Our staff will call the number listed on your records the next business day following your procedure to check on you and address any questions or concerns that you may have regarding the information given to you following your procedure. If we do not reach you, we will leave a message.  However, if you are feeling well and you are not experiencing any problems, there is no need to return our call.  We will assume that you have returned to your regular daily activities without incident.  If any biopsies were taken you will be contacted by phone or by letter within the next 1-3 weeks.  Please call us at [phone] if you have not heard about the biopsies in 3 weeks.    SIGNATURES/CONFIDENTIALITY: You and/or your care partner have signed paperwork which will be entered into your electronic medical record.  These signatures attest to the fact that that the information above on your After Visit Summary has been reviewed and is understood.  Full responsibility of the confidentiality of this discharge information lies with you and/or your care-partner.  No aspirin nor ibuprofen for two weeks per Dr. Havery Moros.

## 2017-02-11 NOTE — Progress Notes (Signed)
Report given to PACU, vss 

## 2017-02-11 NOTE — Progress Notes (Signed)
Called to room to assist during endoscopic procedure.  Patient ID and intended procedure confirmed with present staff. Received instructions for my participation in the procedure from the performing physician.  

## 2017-02-12 ENCOUNTER — Telehealth: Payer: Self-pay

## 2017-02-12 NOTE — Telephone Encounter (Signed)
  Follow up Call-  Call back number 02/11/2017  Post procedure Call Back phone  # [phone]  Permission to leave phone message Yes  Some recent data might be hidden     Patient questions:  Do you have a fever, pain , or abdominal swelling? No. Pain Score  0 *  Have you tolerated food without any problems? Yes.    Have you been able to return to your normal activities? Yes.    Do you have any questions about your discharge instructions: Diet   No. Medications  No. Follow up visit  No.  Do you have questions or concerns about your Care? No.  Actions: * If pain score is 4 or above: No action needed, pain <4.

## 2017-02-25 ENCOUNTER — Encounter: Payer: Self-pay | Admitting: Gastroenterology

## 2019-12-29 ENCOUNTER — Other Ambulatory Visit: Payer: Self-pay | Admitting: Nurse Practitioner

## 2019-12-29 DIAGNOSIS — G43809 Other migraine, not intractable, without status migrainosus: Secondary | ICD-10-CM

## 2019-12-29 DIAGNOSIS — G4452 New daily persistent headache (NDPH): Secondary | ICD-10-CM

## 2020-01-12 ENCOUNTER — Other Ambulatory Visit: Payer: Self-pay | Admitting: Nurse Practitioner

## 2020-10-25 ENCOUNTER — Other Ambulatory Visit: Payer: Self-pay | Admitting: Nurse Practitioner

## 2020-10-25 DIAGNOSIS — M542 Cervicalgia: Secondary | ICD-10-CM

## 2021-01-29 ENCOUNTER — Other Ambulatory Visit: Payer: Self-pay | Admitting: Nurse Practitioner

## 2021-01-29 DIAGNOSIS — M542 Cervicalgia: Secondary | ICD-10-CM

## 2021-03-10 ENCOUNTER — Other Ambulatory Visit: Payer: Self-pay

## 2021-03-10 ENCOUNTER — Ambulatory Visit
Admission: RE | Admit: 2021-03-10 | Discharge: 2021-03-10 | Disposition: A | Discharge status: Home / Self Care | Payer: Medicare Other | Source: Ambulatory Visit | Attending: Nurse Practitioner | Admitting: Nurse Practitioner

## 2021-03-10 DIAGNOSIS — M542 Cervicalgia: Secondary | ICD-10-CM

## 2021-04-25 ENCOUNTER — Other Ambulatory Visit: Payer: Self-pay | Admitting: Nurse Practitioner

## 2021-04-25 DIAGNOSIS — M5136 Other intervertebral disc degeneration, lumbar region: Secondary | ICD-10-CM

## 2021-04-29 ENCOUNTER — Other Ambulatory Visit: Payer: Self-pay

## 2021-04-29 ENCOUNTER — Ambulatory Visit
Admission: RE | Admit: 2021-04-29 | Discharge: 2021-04-29 | Disposition: A | Discharge status: Home / Self Care | Payer: Medicare Other | Source: Ambulatory Visit | Attending: Nurse Practitioner | Admitting: Nurse Practitioner

## 2021-04-29 DIAGNOSIS — M5136 Other intervertebral disc degeneration, lumbar region: Secondary | ICD-10-CM

## 2021-05-03 ENCOUNTER — Other Ambulatory Visit: Payer: Medicare Other

## 2022-02-20 IMAGING — MR MR CERVICAL SPINE W/O CM
4 of 5 series · 31 of 48 positions shown · non-contrast
Comparison: None.

CLINICAL DATA: Chronic neck pain

EXAM:
MRI CERVICAL SPINE WITHOUT CONTRAST
TECHNIQUE: Multiplanar, multisequence MR imaging of the cervical spine was
performed. No intravenous contrast was administered.

[Series 4: tir sag · sagittal · 3.0mm · 0.41mm/px · 4 of 18 slices shown]
[im 1/18]
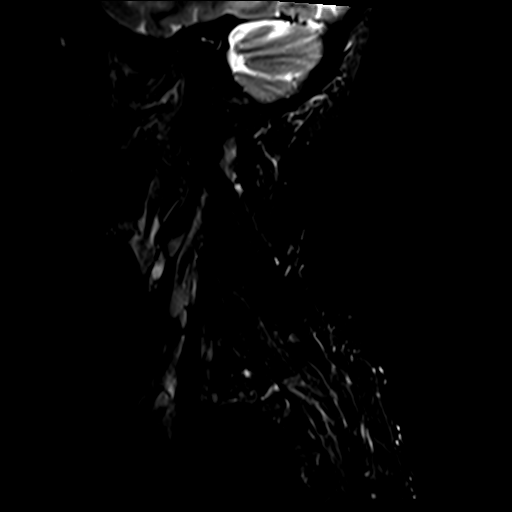
[im 3/18]
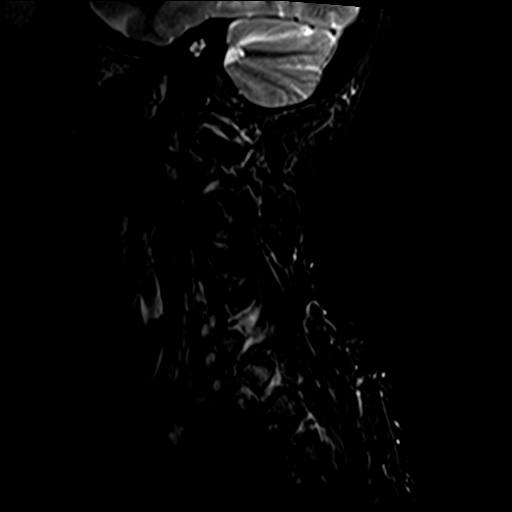
[im 9/18]
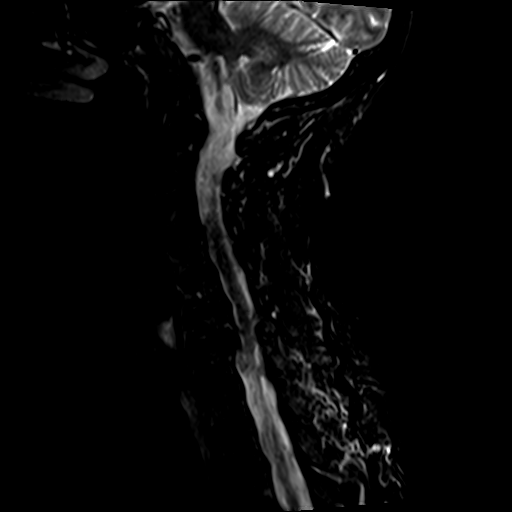
[im 15/18]
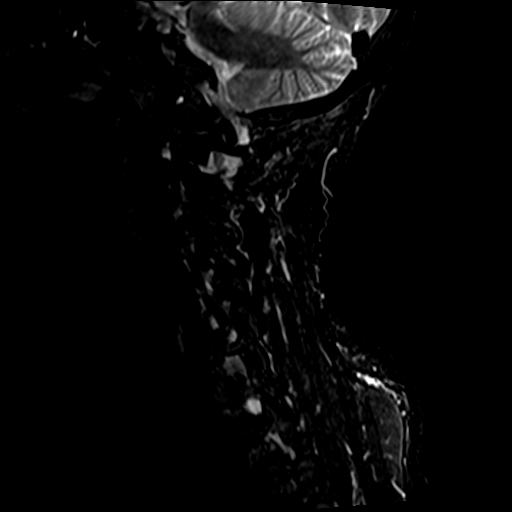

[Series 5: T2 · sagittal · 3.0mm · 0.66mm/px · 8 of 18 slices shown (1 of 2)]
[im 1/18]
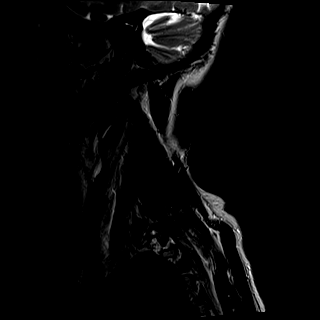
[im 3/18]
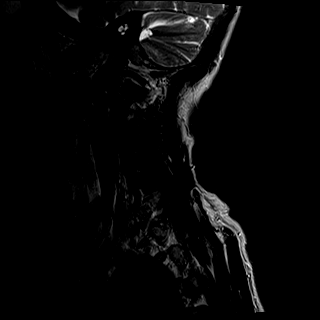
[im 5/18]
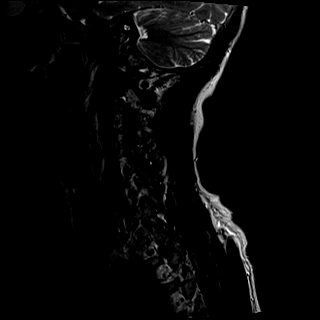
[im 8/18]
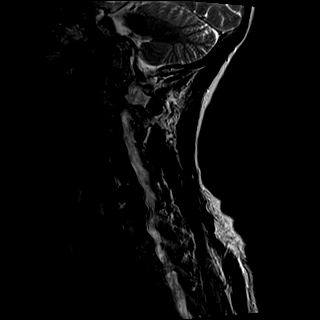
[im 10/18]
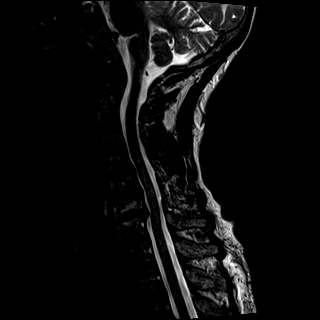
[im 13/18]
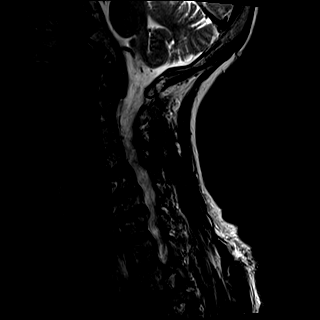
[im 15/18]
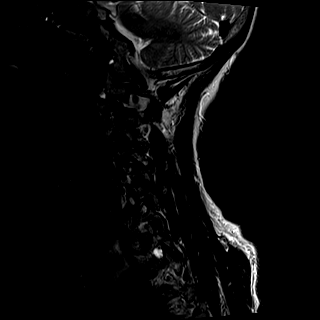
[im 18/18]
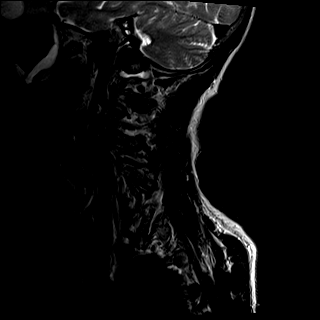

[Series 6: T1 · sagittal · 3.0mm · 0.41mm/px · 8 of 18 slices shown]
[im 1/18]
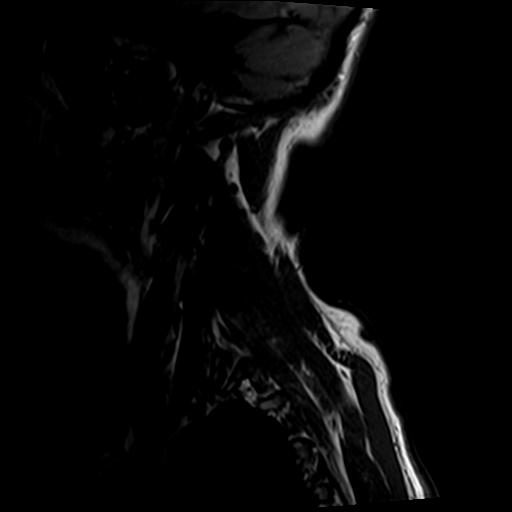
[im 3/18]
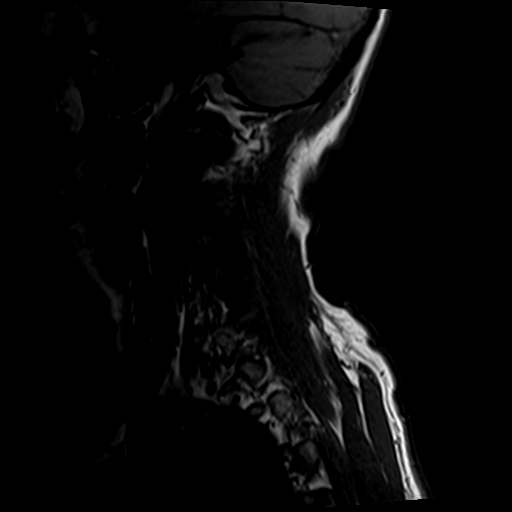
[im 5/18]
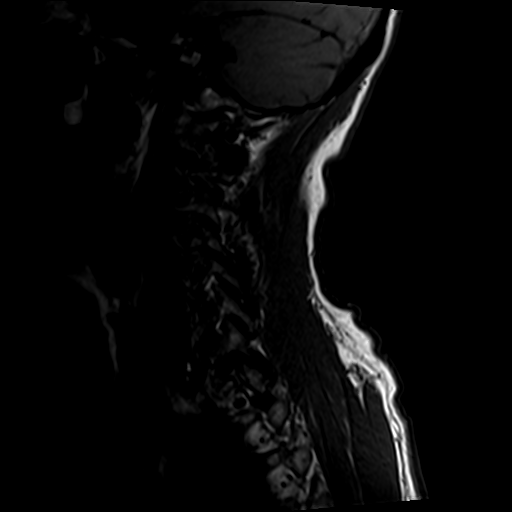
[im 8/18]
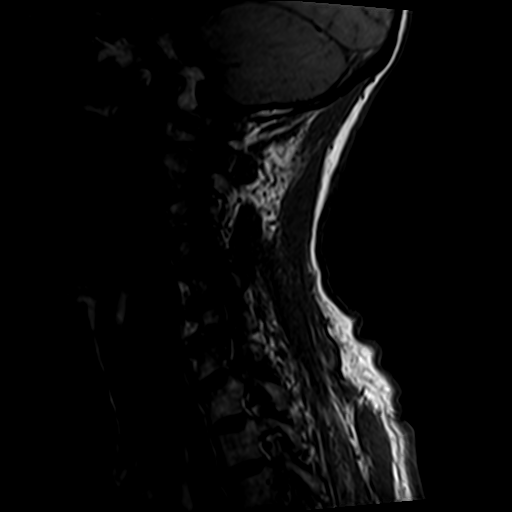
[im 10/18]
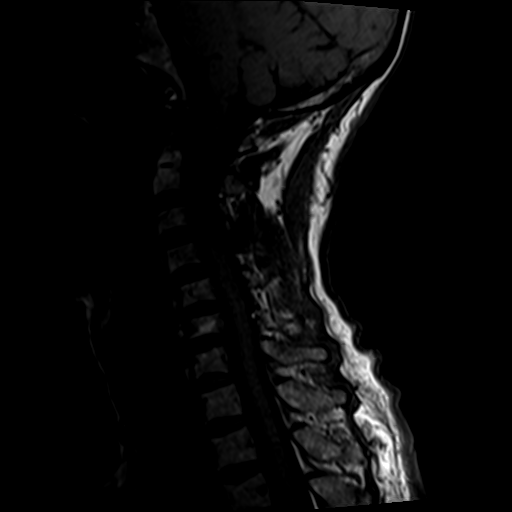
[im 13/18]
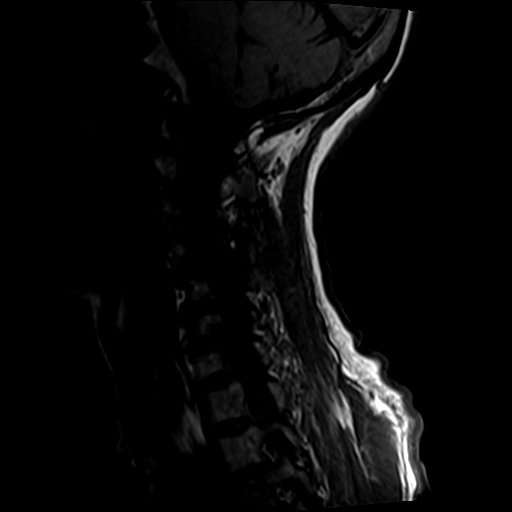
[im 15/18]
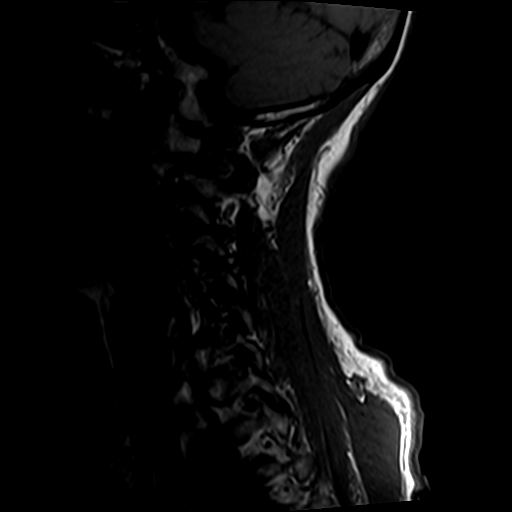
[im 18/18]
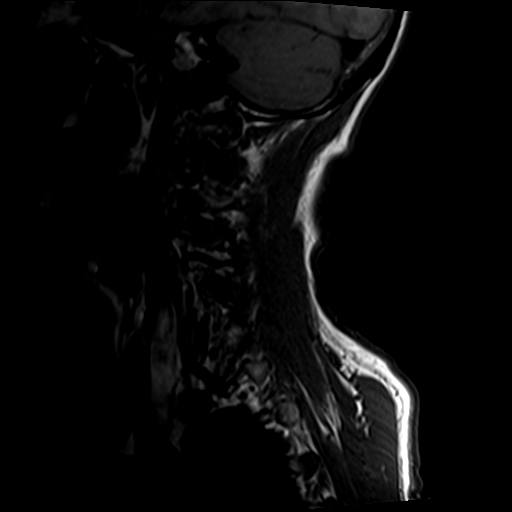

[Series 8: T2 · axial · 3.0mm · 0.70mm/px · z∈[-51,+39]mm · 11 of 25 slices shown (2 of 2)]
[im 1/25]
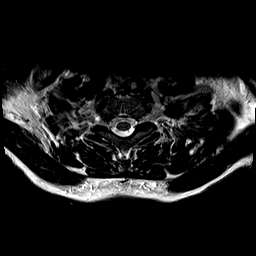
[im 3/25]
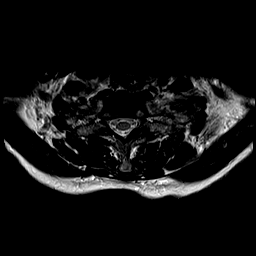
[im 5/25]
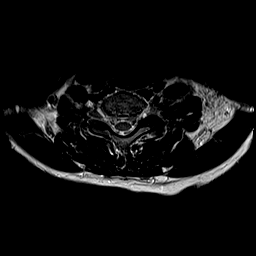
[im 8/25]
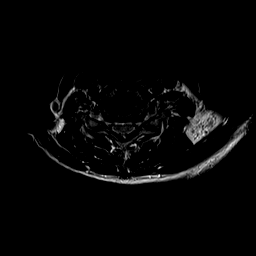
[im 10/25]
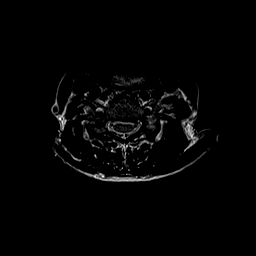
[im 13/25]
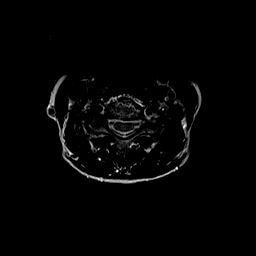
[im 15/25]
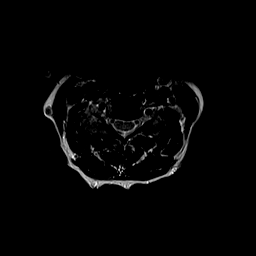
[im 17/25]
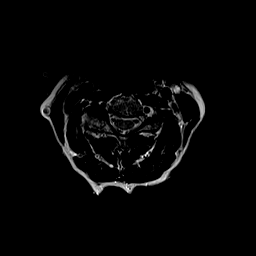
[im 20/25]
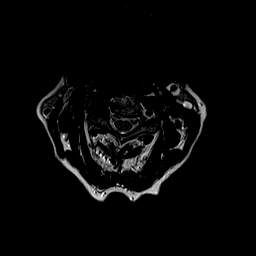
[im 22/25]
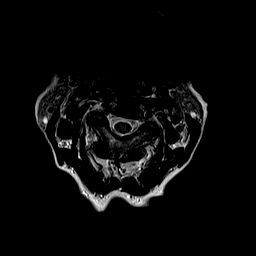
[im 25/25]
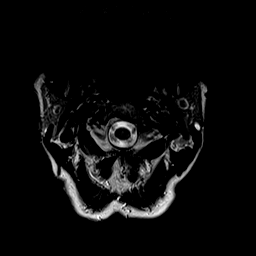

[31 of 48 positions shown; findings below may reference images not displayed]

FINDINGS: Alignment: Trace anterolisthesis at C4-C5

Vertebrae: Degenerative endplate irregularity at C6-C7. Vertebral
body heights are otherwise maintained. Mild degenerative endplate
marrow edema at C6-C7. No suspicious osseous lesion.

Cord: Normal signal and caliber.

Posterior Fossa, vertebral arteries, paraspinal tissues:
Unremarkable.

Disc levels:

C2-C3:  Facet hypertrophy.  No canal or foraminal stenosis.

C3-C4: Marked right facet hypertrophy. No canal or foraminal
stenosis.

C4-C5: Trace anterolisthesis with disc uncovering. Marked right
facet hypertrophy. No canal or foraminal stenosis.

C5-C6: Disc bulge with endplate osteophytes. Facet and uncovertebral
hypertrophy. No canal or left foraminal stenosis. Mild right
foraminal stenosis.

C6-C7: Disc bulge with endplate osteophytes. Uncovertebral and facet
hypertrophy. Mild canal and foraminal stenosis.

C7-T1:  Facet hypertrophy.  No canal or foraminal stenosis.
IMPRESSION: Multilevel degenerative changes as detailed above. No high-grade
stenosis. Marked right facet degeneration at C3-C4 and C4-C5.

## 2022-03-10 ENCOUNTER — Encounter: Payer: Self-pay | Admitting: Gastroenterology

## 2023-09-01 ENCOUNTER — Ambulatory Visit: Payer: 59 | Admitting: Podiatry

## 2024-04-05 ENCOUNTER — Ambulatory Visit: Payer: Medicare Other | Admitting: Dermatology

## 2024-06-21 ENCOUNTER — Ambulatory Visit: Admitting: Podiatry

## 2024-06-26 ENCOUNTER — Ambulatory Visit (INDEPENDENT_AMBULATORY_CARE_PROVIDER_SITE_OTHER): Admitting: Podiatry

## 2024-06-26 ENCOUNTER — Ambulatory Visit (INDEPENDENT_AMBULATORY_CARE_PROVIDER_SITE_OTHER)

## 2024-06-26 ENCOUNTER — Encounter: Payer: Self-pay | Admitting: Podiatry

## 2024-06-26 VITALS — Ht 62.0 in | Wt 113.0 lb

## 2024-06-26 DIAGNOSIS — M722 Plantar fascial fibromatosis: Secondary | ICD-10-CM

## 2024-06-26 DIAGNOSIS — M7752 Other enthesopathy of left foot: Secondary | ICD-10-CM

## 2024-06-26 DIAGNOSIS — M7751 Other enthesopathy of right foot: Secondary | ICD-10-CM | POA: Diagnosis not present

## 2024-06-26 DIAGNOSIS — G629 Polyneuropathy, unspecified: Secondary | ICD-10-CM | POA: Diagnosis not present

## 2024-06-26 MED ORDER — BETAMETHASONE SOD PHOS & ACET 6 (3-3) MG/ML IJ SUSP
3.0000 mg | Freq: Once | INTRAMUSCULAR | Status: AC
  Administered 2024-06-26: 3 mg via INTRA_ARTICULAR

## 2024-06-26 MED ORDER — METHYLPREDNISOLONE 4 MG PO TBPK: ORAL_TABLET | ORAL | 0 refills | Status: DC

## 2024-06-26 MED ORDER — METHYLPREDNISOLONE 4 MG PO TBPK: ORAL_TABLET | ORAL | 0 refills | Status: AC

## 2024-06-26 MED ORDER — MELOXICAM 15 MG PO TABS: 15.0000 mg | ORAL_TABLET | Freq: Every day | ORAL | 1 refills | Status: DC

## 2024-06-26 NOTE — Progress Notes (Signed)
   Chief Complaint  Patient presents with   Foot Pain    Pt is here due to bilateral foot pain, states both feet have burning sensations, not a diabetic, also complains of right heel pain states it's plantar fasciitis.    Subjective: 73 y.o. female presenting today as a new patient for evaluation of right heel pain ongoing for about 8 months now.  She also has a history of lumbar radiculopathy and burning sensation of the bilateral feet.  She has tried gabapentin and sees a pain management specialist in [facility].    Past Medical History:  Diagnosis Date   Arthritis    Hypertension     Past Surgical History:  Procedure Laterality Date   ABDOMINAL HYSTERECTOMY     KNEE SURGERY Left    OPEN REDUCTION INTERNAL FIXATION (ORIF) FINGER WITH RADIAL BONE GRAFT Left 01/16/2014   Procedure: Irrigation and debridement OPEN REDUCTION INTERNAL FIXATION (ORIF) Left Index FINGER, fasciotomy;  Surgeon: Franky JONELLE Curia, MD;  Location: [facility] OR;  Service: Orthopedics;  Laterality: Left;   VAGINAL HYSTERECTOMY N/A     Objective: Physical Exam General: The patient is alert and oriented x3 in no acute distress.  Dermatology: Skin is warm, dry and supple bilateral lower extremities. Negative for open lesions or macerations bilateral.   Vascular: Dorsalis Pedis and Posterior Tibial pulses palpable bilateral.  Capillary fill time is immediate to all digits.  Neurological: Grossly intact via light touch  Musculoskeletal: Tenderness to palpation to the plantar aspect of the right heel along the plantar fascia. All other joints range of motion within normal limits bilateral. Strength 5/5 in all groups bilateral.   Radiographic exam B/L feet 06/26/2024: Degenerative changes noted throughout the midtarsal joints of the bilateral feet consistent with osteoarthritis.  No acute fractures identified.  No structural deformity  Assessment: 1. Plantar fasciitis right 2.  Lumbar radiculopathy bilateral 3.   Peripheral polyneuropathy bilateral feet  Plan of Care:  -Patient evaluated. Xrays reviewed.   -Injection of 0.5cc Celestone  soluspan injected into the right plantar fascia  -Prescription for Medrol  Dosepak -Prescription for meloxicam  15 mg daily after completion of the Dosepak -Advised against going barefoot.  Recommended supportive tennis shoes and sneakers -Recommend daily calf stretching, specifically calf stretches to alleviate posterior calf tightness - In regards to the lumbar radiculopathy and the neuropathy to the bilateral feet, unfortunately she has tried multiple modalities both oral and topical with no improvement.  I did recommend Qutenza application that perhaps she could discuss with her pain management doctor. -Return to clinic with me PRN   Thresa EMERSON Sar, DPM [facility]  Dr. Thresa EMERSON Sar, DPM    [address] [place], [place] [address]                Office [phone]  Fax [phone]

## 2024-07-11 ENCOUNTER — Telehealth: Payer: Self-pay | Admitting: Lab

## 2024-07-11 NOTE — Telephone Encounter (Signed)
 Patient states had injection with no relief would like recommendations on what is next in her care plan to relieve pain please advise.

## 2024-09-07 ENCOUNTER — Other Ambulatory Visit: Payer: Self-pay | Admitting: Podiatry

## 2024-10-17 ENCOUNTER — Other Ambulatory Visit: Payer: Self-pay | Admitting: Podiatry

## 2024-11-29 ENCOUNTER — Ambulatory Visit: Admitting: Podiatry
